# Patient Record
Sex: Female | Born: 1985 | Race: Black or African American | Hispanic: No | Marital: Single | Smoking: Never smoker
Health system: Southern US, Community
[De-identification: ages and names within clinical notes are randomized; demographics above are authoritative.]

## PROBLEM LIST (undated history)

## (undated) DIAGNOSIS — D219 Benign neoplasm of connective and other soft tissue, unspecified: Secondary | ICD-10-CM

---

## 2016-07-18 DIAGNOSIS — S63649D Sprain of metacarpophalangeal joint of unspecified thumb, subsequent encounter: Secondary | ICD-10-CM | POA: Diagnosis not present

## 2016-07-23 ENCOUNTER — Other Ambulatory Visit: Payer: Self-pay | Admitting: Sports Medicine

## 2016-07-24 ENCOUNTER — Other Ambulatory Visit: Payer: Self-pay | Admitting: Sports Medicine

## 2016-07-24 DIAGNOSIS — M65311 Trigger thumb, right thumb: Secondary | ICD-10-CM

## 2016-07-24 DIAGNOSIS — M79641 Pain in right hand: Secondary | ICD-10-CM

## 2016-08-08 ENCOUNTER — Ambulatory Visit
Admission: RE | Admit: 2016-08-08 | Discharge: 2016-08-08 | Disposition: A | Discharge status: Home / Self Care | Payer: BLUE CROSS/BLUE SHIELD | Source: Ambulatory Visit | Attending: Sports Medicine | Admitting: Sports Medicine

## 2016-08-08 DIAGNOSIS — M65311 Trigger thumb, right thumb: Secondary | ICD-10-CM

## 2016-08-08 DIAGNOSIS — M79644 Pain in right finger(s): Secondary | ICD-10-CM | POA: Diagnosis not present

## 2016-08-08 DIAGNOSIS — M79641 Pain in right hand: Secondary | ICD-10-CM

## 2016-08-08 MED ORDER — GADOBENATE DIMEGLUMINE 529 MG/ML IV SOLN
14.0000 mL | Freq: Once | INTRAVENOUS | Status: AC | PRN
  Administered 2016-08-08: 14 mL via INTRAVENOUS

## 2016-08-12 DIAGNOSIS — S63649D Sprain of metacarpophalangeal joint of unspecified thumb, subsequent encounter: Secondary | ICD-10-CM | POA: Diagnosis not present

## 2016-12-20 DIAGNOSIS — J069 Acute upper respiratory infection, unspecified: Secondary | ICD-10-CM | POA: Diagnosis not present

## 2017-01-13 DIAGNOSIS — Z124 Encounter for screening for malignant neoplasm of cervix: Secondary | ICD-10-CM | POA: Diagnosis not present

## 2017-01-13 DIAGNOSIS — Z01419 Encounter for gynecological examination (general) (routine) without abnormal findings: Secondary | ICD-10-CM | POA: Diagnosis not present

## 2017-01-13 DIAGNOSIS — Z6833 Body mass index (BMI) 33.0-33.9, adult: Secondary | ICD-10-CM | POA: Diagnosis not present

## 2017-01-13 DIAGNOSIS — N920 Excessive and frequent menstruation with regular cycle: Secondary | ICD-10-CM | POA: Diagnosis not present

## 2017-01-21 DIAGNOSIS — Z6833 Body mass index (BMI) 33.0-33.9, adult: Secondary | ICD-10-CM | POA: Diagnosis not present

## 2017-01-21 DIAGNOSIS — Z23 Encounter for immunization: Secondary | ICD-10-CM | POA: Diagnosis not present

## 2017-01-21 DIAGNOSIS — Z0000 Encounter for general adult medical examination without abnormal findings: Secondary | ICD-10-CM | POA: Diagnosis not present

## 2017-06-10 ENCOUNTER — Encounter (HOSPITAL_COMMUNITY): Payer: Self-pay | Admitting: *Deleted

## 2017-06-10 ENCOUNTER — Emergency Department (HOSPITAL_COMMUNITY)
Admission: EM | Admit: 2017-06-10 | Discharge: 2017-06-11 | Disposition: A | Discharge status: Home / Self Care | Payer: BLUE CROSS/BLUE SHIELD | Attending: Emergency Medicine | Admitting: Emergency Medicine

## 2017-06-10 ENCOUNTER — Emergency Department (HOSPITAL_COMMUNITY): Payer: BLUE CROSS/BLUE SHIELD

## 2017-06-10 DIAGNOSIS — R51 Headache: Secondary | ICD-10-CM | POA: Diagnosis not present

## 2017-06-10 DIAGNOSIS — R519 Headache, unspecified: Secondary | ICD-10-CM

## 2017-06-10 HISTORY — DX: Benign neoplasm of connective and other soft tissue, unspecified: D21.9

## 2017-06-10 NOTE — ED Triage Notes (Signed)
Pt has had occasional sharp shooting pains about 1-2 times a day for the past 2 weeks.  This pain lasts only a second or 2 and then goes away.  No nausea, vomiting or photosensitivity with this.  Pt denies any weakness or changes in vision but she decided to come in today due to tingling in right cheek.  No acute distress at this time.  Pt is alert and oriented

## 2017-06-10 NOTE — ED Notes (Signed)
Patient transported to CT 

## 2017-06-10 NOTE — ED Provider Notes (Signed)
[facility] DEPT Provider Note   CSN: [id number] Arrival date & time: 06/10/17  1920  By signing my name below, I, Ephriam Jenkins, attest that this documentation has been prepared under the direction and in the presence of Ruth Tully PA-C.  Electronically Signed: Ephriam Jenkins, ED Scribe. 06/10/17. 10:22 PM.  History   Chief Complaint Chief Complaint  Patient presents with  . Headache   HPI HPI Comments: Heather Chapman is a 31 y.o. female who presents to the Emergency Department complaining of intermittent right sided headache which radiates to the right side of her cheek, onset approximately two weeks ago. She describes the pain as lasting a few seconds and then subsides. Pt further reports a pins and needles sensation to her face during the onset of the intermittent pains. She presents here today because her symptoms were much more prevalent today, especially the sharp pains to the right side of her face. Pt denies any changes in vision. She has not taken any medication in attempt to relieve her symptoms. Pt denies any focal numbness or weakness. She is able to move all four extremities without difficulty. No new medications. No previous neck surgeries. No pain with neck movements. Denies any accidents or injuries recently. No nausea, sensitivity to light, blurry vision, loss of vision.   The history is provided by the patient. No language interpreter was used.    Past Medical History:  Diagnosis Date  . Fibroids     There are no active problems to display for this patient.   History reviewed. No pertinent surgical history.  OB History    No data available       Home Medications    Prior to Admission medications   Not on File    Family History No family history on file.  Social History Social History  Substance Use Topics  . Smoking status: Never Smoker  . Smokeless tobacco: Never Used  . Alcohol use Yes     Allergies   Patient has no known  allergies.   Review of Systems Review of Systems  HENT: Negative for ear discharge, ear pain, facial swelling and hearing loss.   Eyes: Negative for photophobia, pain, discharge and itching.  Gastrointestinal: Negative for nausea.  Neurological: Positive for headaches. Negative for syncope, weakness and numbness.     Physical Exam Updated Vital Signs BP 128/84 (BP Location: Right Arm)   Pulse 84   Temp 98.4 F (36.9 C) (Oral)   Resp (!) 22   Ht 4\' 11"  (1.499 m)   Wt 71.7 kg (158 lb)   LMP 05/26/2017 (Approximate)   SpO2 100%   BMI 31.91 kg/m   Physical Exam  Constitutional: She appears well-developed and well-nourished. No distress.  HENT:  Head: Normocephalic and atraumatic.  Nose: Nose normal.  Eyes: Conjunctivae and EOM are normal. Left eye exhibits no discharge. No scleral icterus.  Neck: Normal range of motion. Neck supple.  Cardiovascular: Normal rate and intact distal pulses.   Pulmonary/Chest: Effort normal. No respiratory distress.  Abdominal: Soft. Bowel sounds are normal. She exhibits no distension. There is no tenderness. There is no guarding.  Musculoskeletal: Normal range of motion. She exhibits no edema.  Neurological: She is alert. No cranial nerve deficit or sensory deficit. She exhibits normal muscle tone. Coordination normal.  Pupils equal and reactive. No facial asymmetry noted. Cranial nerves appear grossly intact. Sensation intact to light touch on face, BUE and BLE. Strength 5/5 in BUE and BLE. Normal patellar reflexes bilaterally.  Skin: Skin is warm and dry. No rash noted.  Psychiatric: She has a normal mood and affect.  Nursing note and vitals reviewed.    ED Treatments / Results  DIAGNOSTIC STUDIES: Oxygen Saturation is 100% on RA, normal by my interpretation.  COORDINATION OF CARE: 10:31 PM-Discussed treatment plan with pt at bedside and pt agreed to plan.   Labs (all labs ordered are listed, but only abnormal results are  displayed) Labs Reviewed - No data to display  EKG  EKG Interpretation None       Radiology Ct Head Wo Contrast  Result Date: 06/10/2017 CLINICAL DATA:  Right-sided headache.  Cheek pain. EXAM: CT HEAD WITHOUT CONTRAST TECHNIQUE: Contiguous axial images were obtained from the base of the skull through the vertex without intravenous contrast. COMPARISON:  None. FINDINGS: Brain: No evidence of acute infarction, hemorrhage, hydrocephalus, extra-axial collection or mass lesion/mass effect. Vascular: No hyperdense vessel or unexpected calcification. Skull: Normal. Negative for fracture or focal lesion. Sinuses/Orbits: Paranasal sinuses and mastoid air cells are clear. The visualized orbits are unremarkable. Other: None. IMPRESSION: No acute intracranial abnormality. Electronically Signed   By: Jeb Levering M.D.   On: 06/10/2017 23:44    Procedures Procedures (including critical care time)  Medications Ordered in ED Medications - No data to display   Initial Impression / Assessment and Plan / ED Course  I have reviewed the triage vital signs and the nursing notes.  Pertinent labs & imaging results that were available during my care of the patient were reviewed by me and considered in my medical decision making (see chart for details).     Patient presents to ED for intermittent headache on the right side of her head as well as some tingling on her cheek. She states that symptoms have been intermittent and only last a few seconds. She has not taken any medications to help with the pain. She denies any vision changes including no blurry vision, no loss of vision, loss of consciousness or head injury. She denies any numbness or weakness and she is ambulatory similar to baseline. She denies any pain on her face but states that it is just a tingling sensation. She states that neither the tingling sensation nor the headache are unbearable. She states that each episode improves on its own  without any intervention. She is afebrile with no history of fever. On physical exam her pupils are reactive. She has no gross cranial nerve deficit, she is alert and oriented, she has no abnormal coordination, no abnormal sensory deficit or abnormal tone. There are no focal findings on her neurological exam that would concern me. She has sensation intact to light touch in upper and lower extremities as well as the face. She did not experiencing any pain here in the ED today. CT of the head was obtained and was negative for acute intracranial process. Her symptoms could be due to a migraine or tension headache that she has been experiencing intermittently. I have low suspicion for trigeminal neuralgia as being the cause of her symptoms considering that she denies any sharp or shooting unbearable pain on her face. I encouraged her to follow up with her primary care provider and to take Tylenol as needed for the headache. Patient appears stable for discharge at this time. Should return precautions given for worsening or severe symptoms such as numbness, weakness, vision changes or trouble walking.  Final Clinical Impressions(s) / ED Diagnoses   Final diagnoses:  Acute nonintractable headache, unspecified headache type  New Prescriptions There are no discharge medications for this patient.  I personally performed the services described in this documentation, which was scribed in my presence. The recorded information has been reviewed and is accurate.     Delia Heady, PA-C 06/11/17 [id number]    Fredia Sorrow, MD 06/13/17 [phone]

## 2017-06-11 NOTE — Discharge Instructions (Signed)
Follow-up with PCP for further evaluation. Return to ED for worsening pain, vision changes, head injury, loss of consciousness, neck pain, numbness or weakness.

## 2017-06-11 NOTE — ED Notes (Signed)
Pt is in stable condition upon d/c and ambulates from ED. 

## 2018-01-22 DIAGNOSIS — Z713 Dietary counseling and surveillance: Secondary | ICD-10-CM | POA: Diagnosis not present

## 2018-01-22 DIAGNOSIS — Z136 Encounter for screening for cardiovascular disorders: Secondary | ICD-10-CM | POA: Diagnosis not present

## 2018-01-22 DIAGNOSIS — Z13220 Encounter for screening for lipoid disorders: Secondary | ICD-10-CM | POA: Diagnosis not present

## 2018-01-22 DIAGNOSIS — Z6831 Body mass index (BMI) 31.0-31.9, adult: Secondary | ICD-10-CM | POA: Diagnosis not present

## 2018-05-14 ENCOUNTER — Other Ambulatory Visit: Payer: Self-pay | Admitting: Obstetrics and Gynecology

## 2018-05-14 ENCOUNTER — Other Ambulatory Visit (HOSPITAL_COMMUNITY)
Admission: RE | Admit: 2018-05-14 | Discharge: 2018-05-14 | Disposition: A | Discharge status: Home / Self Care | Payer: BLUE CROSS/BLUE SHIELD | Source: Ambulatory Visit | Attending: Obstetrics and Gynecology | Admitting: Obstetrics and Gynecology

## 2018-05-14 DIAGNOSIS — Z01411 Encounter for gynecological examination (general) (routine) with abnormal findings: Secondary | ICD-10-CM | POA: Insufficient documentation

## 2018-05-14 DIAGNOSIS — N920 Excessive and frequent menstruation with regular cycle: Secondary | ICD-10-CM | POA: Diagnosis not present

## 2018-05-14 DIAGNOSIS — D219 Benign neoplasm of connective and other soft tissue, unspecified: Secondary | ICD-10-CM | POA: Diagnosis not present

## 2018-05-19 LAB — CYTOLOGY - PAP
Diagnosis: NEGATIVE
HPV: NOT DETECTED

## 2018-06-09 DIAGNOSIS — D219 Benign neoplasm of connective and other soft tissue, unspecified: Secondary | ICD-10-CM | POA: Diagnosis not present

## 2018-06-15 DIAGNOSIS — D251 Intramural leiomyoma of uterus: Secondary | ICD-10-CM | POA: Diagnosis not present

## 2018-06-15 DIAGNOSIS — N920 Excessive and frequent menstruation with regular cycle: Secondary | ICD-10-CM | POA: Diagnosis not present

## 2018-06-23 IMAGING — CT CT HEAD W/O CM
3 series · 16 of 47 positions shown, 19 images · non-contrast
Comparison: None.

CLINICAL DATA: Right-sided headache.  Cheek pain.

EXAM:
CT HEAD WITHOUT CONTRAST
TECHNIQUE: Contiguous axial images were obtained from the base of the skull
through the vertex without intravenous contrast.

[Series 3: head 5.0 h30s · axial · 0.42mm/px · z∈[-98,+27]mm · 10 of 30 slices shown, 13 images]
[im 3/30  brain]
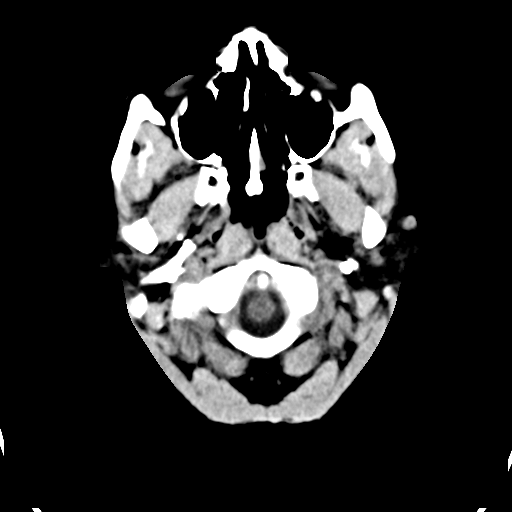
[im 3/30  bone]
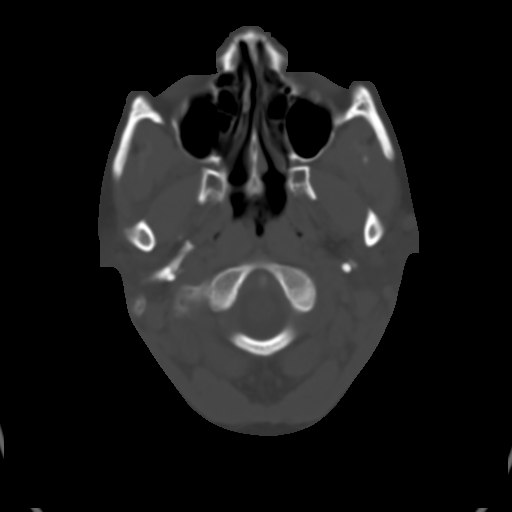
[im 6/30  brain]
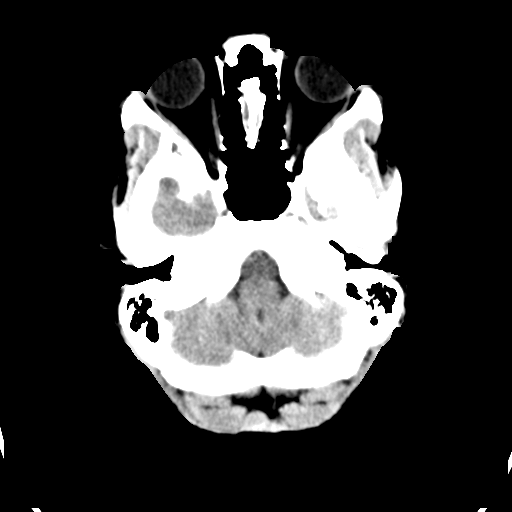
[im 9/30  brain]
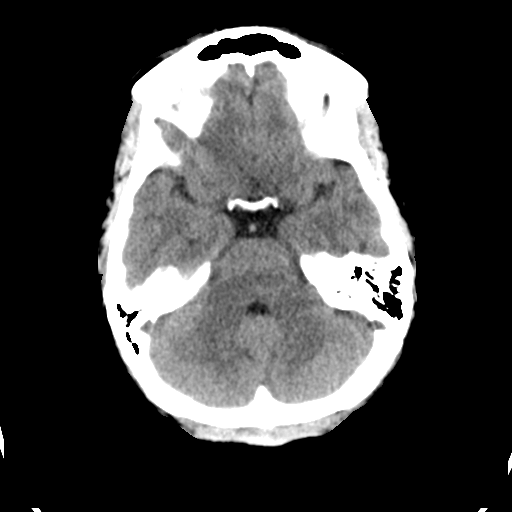
[im 11/30  brain]
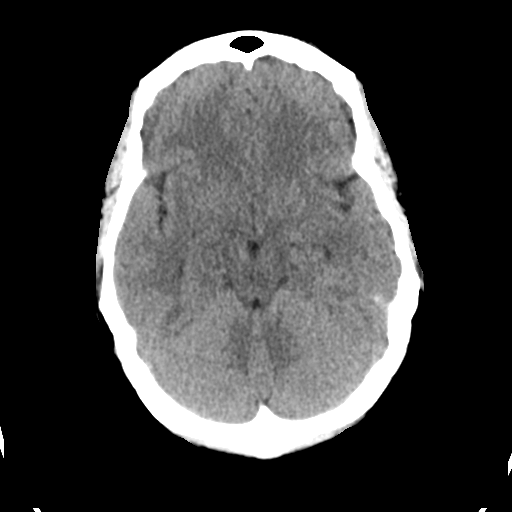
[im 14/30  brain]
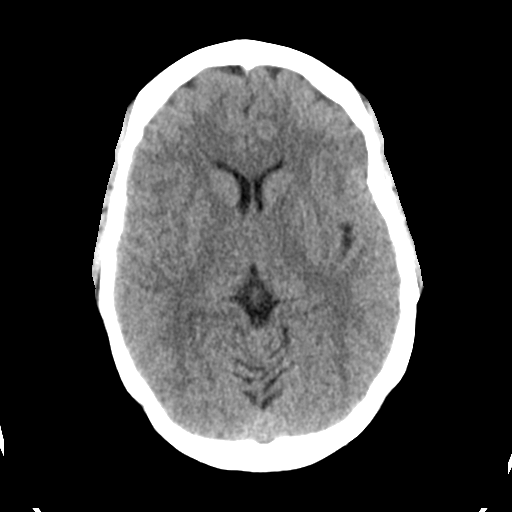
[im 14/30  bone]
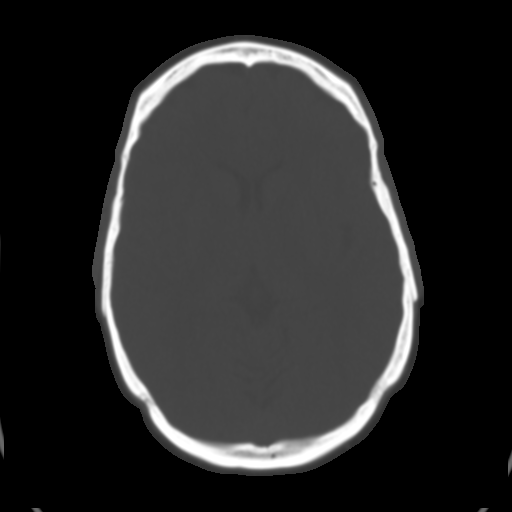
[im 17/30  brain]
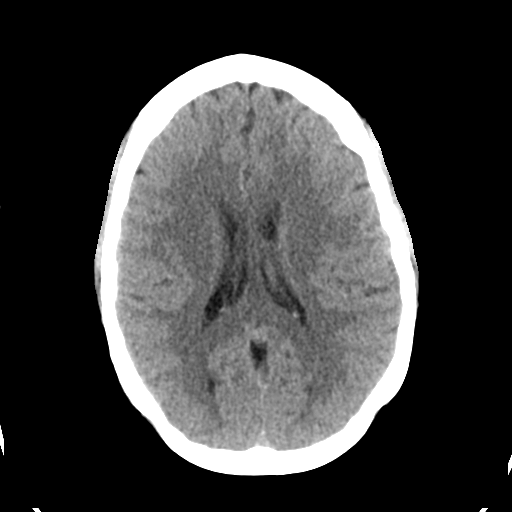
[im 20/30  brain]
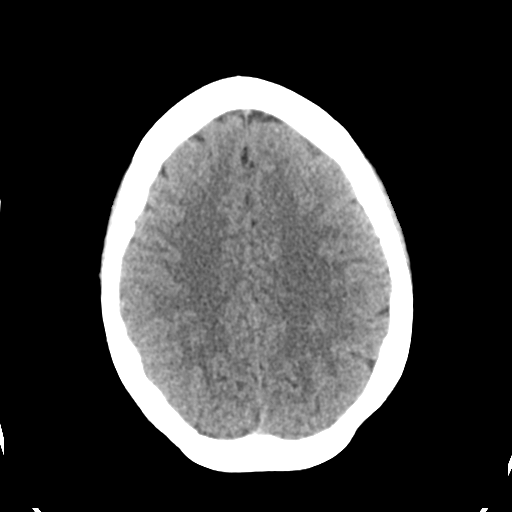
[im 23/30  brain]
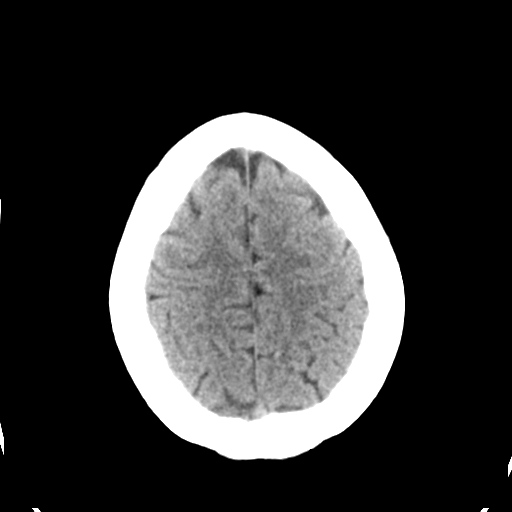
[im 25/30  brain]
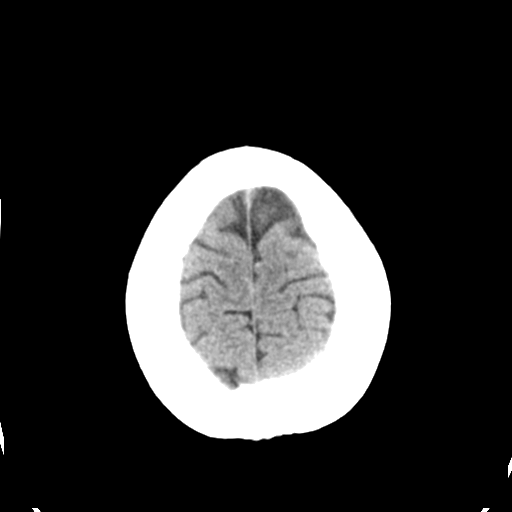
[im 25/30  bone]
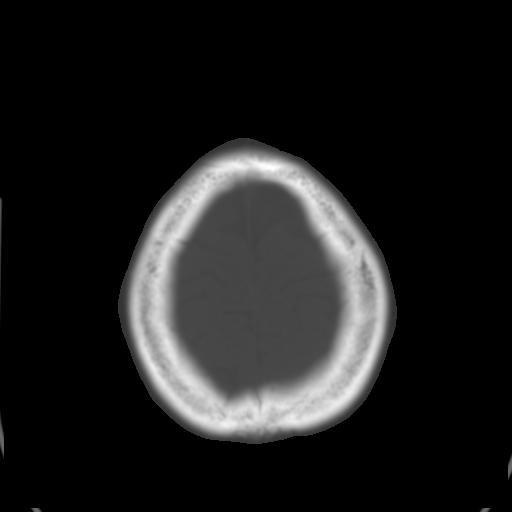
[im 28/30  brain]
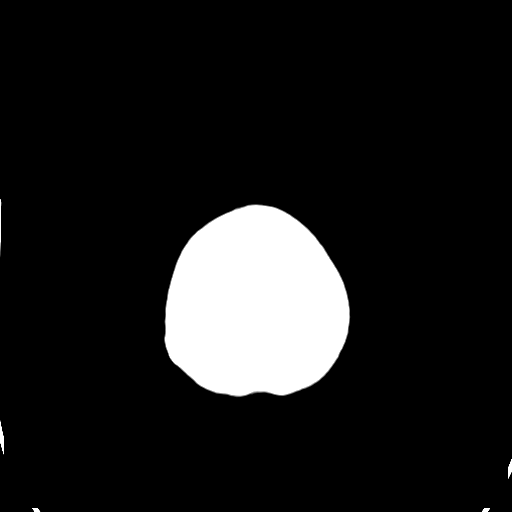

[Series 5: head 3.0 mpr cor · coronal · 0.29mm/px · 3 of 67 slices shown]
[im 23/67  brain]
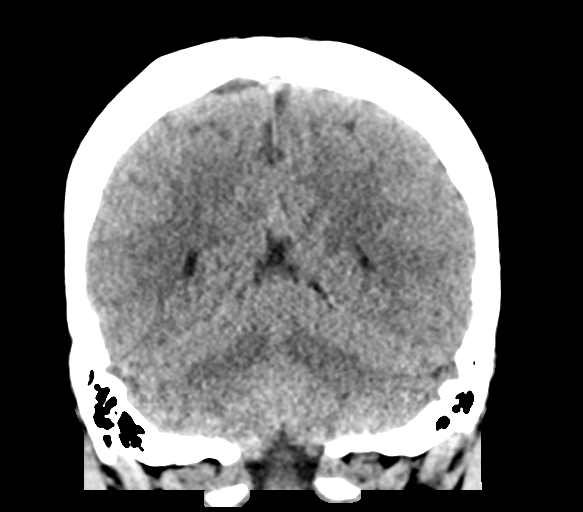
[im 30/67  brain]
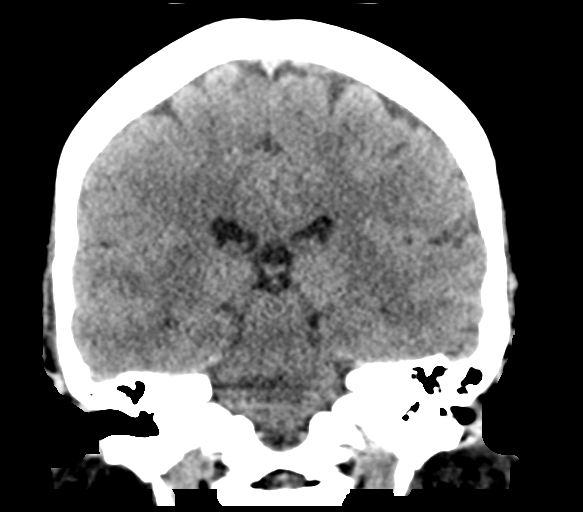
[im 37/67  brain]
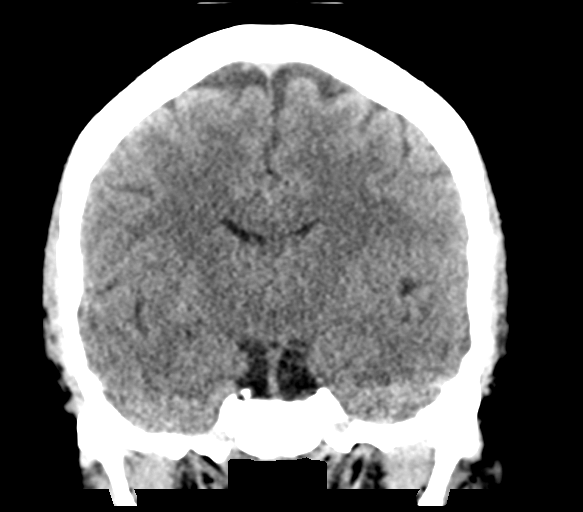

[Series 6: head 3.0 mpr sag · sagittal · 0.30mm/px · 3 of 53 slices shown]
[im 18/53  brain]
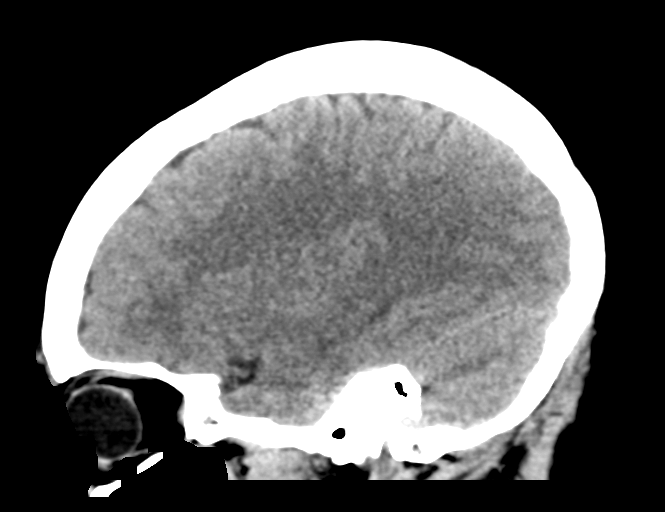
[im 27/53  brain]
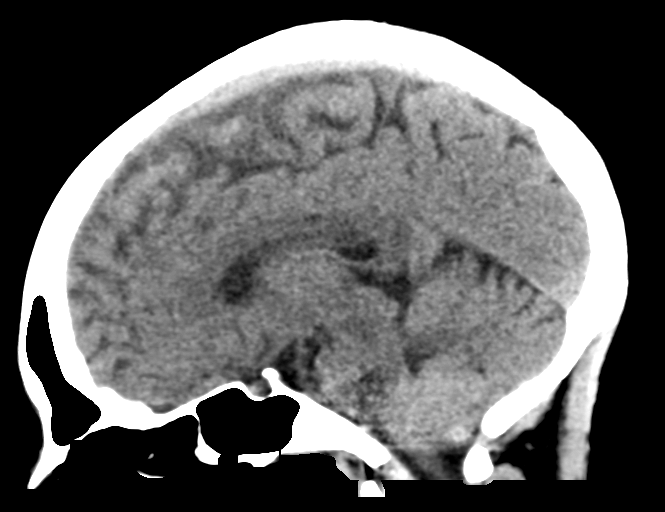
[im 35/53  brain]
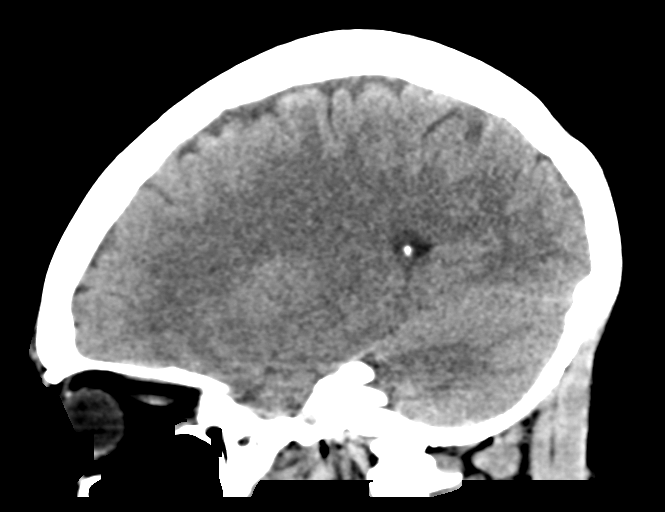

[16 of 47 positions shown; findings below may reference images not displayed]

FINDINGS: Brain: No evidence of acute infarction, hemorrhage, hydrocephalus,
extra-axial collection or mass lesion/mass effect.

Vascular: No hyperdense vessel or unexpected calcification.

Skull: Normal. Negative for fracture or focal lesion.

Sinuses/Orbits: Paranasal sinuses and mastoid air cells are clear.
The visualized orbits are unremarkable.

Other: None.
IMPRESSION: No acute intracranial abnormality.

## 2018-12-30 DIAGNOSIS — N898 Other specified noninflammatory disorders of vagina: Secondary | ICD-10-CM | POA: Diagnosis not present

## 2018-12-30 DIAGNOSIS — N920 Excessive and frequent menstruation with regular cycle: Secondary | ICD-10-CM | POA: Diagnosis not present

## 2018-12-30 DIAGNOSIS — R350 Frequency of micturition: Secondary | ICD-10-CM | POA: Diagnosis not present

## 2019-01-18 ENCOUNTER — Other Ambulatory Visit: Payer: Self-pay | Admitting: Obstetrics and Gynecology

## 2019-01-18 DIAGNOSIS — N920 Excessive and frequent menstruation with regular cycle: Secondary | ICD-10-CM | POA: Diagnosis not present

## 2019-01-18 DIAGNOSIS — D259 Leiomyoma of uterus, unspecified: Secondary | ICD-10-CM | POA: Diagnosis not present

## 2019-01-18 DIAGNOSIS — D219 Benign neoplasm of connective and other soft tissue, unspecified: Secondary | ICD-10-CM | POA: Diagnosis not present

## 2019-01-18 DIAGNOSIS — Z3202 Encounter for pregnancy test, result negative: Secondary | ICD-10-CM | POA: Diagnosis not present

## 2019-01-27 DIAGNOSIS — Z13220 Encounter for screening for lipoid disorders: Secondary | ICD-10-CM | POA: Diagnosis not present

## 2019-01-27 DIAGNOSIS — Z131 Encounter for screening for diabetes mellitus: Secondary | ICD-10-CM | POA: Diagnosis not present

## 2019-01-27 DIAGNOSIS — Z713 Dietary counseling and surveillance: Secondary | ICD-10-CM | POA: Diagnosis not present

## 2019-01-27 DIAGNOSIS — Z136 Encounter for screening for cardiovascular disorders: Secondary | ICD-10-CM | POA: Diagnosis not present

## 2019-02-08 DIAGNOSIS — J069 Acute upper respiratory infection, unspecified: Secondary | ICD-10-CM | POA: Diagnosis not present

## 2019-03-08 DIAGNOSIS — Z3162 Encounter for fertility preservation counseling: Secondary | ICD-10-CM | POA: Diagnosis not present

## 2019-03-08 DIAGNOSIS — D251 Intramural leiomyoma of uterus: Secondary | ICD-10-CM | POA: Diagnosis not present

## 2019-03-11 DIAGNOSIS — D508 Other iron deficiency anemias: Secondary | ICD-10-CM | POA: Diagnosis not present

## 2019-03-11 DIAGNOSIS — E288 Other ovarian dysfunction: Secondary | ICD-10-CM | POA: Diagnosis not present

## 2019-03-17 DIAGNOSIS — D251 Intramural leiomyoma of uterus: Secondary | ICD-10-CM | POA: Diagnosis not present

## 2019-03-17 DIAGNOSIS — D252 Subserosal leiomyoma of uterus: Secondary | ICD-10-CM | POA: Diagnosis not present

## 2019-03-17 DIAGNOSIS — Z319 Encounter for procreative management, unspecified: Secondary | ICD-10-CM | POA: Diagnosis not present

## 2019-03-17 DIAGNOSIS — Z3162 Encounter for fertility preservation counseling: Secondary | ICD-10-CM | POA: Diagnosis not present

## 2019-05-18 DIAGNOSIS — D259 Leiomyoma of uterus, unspecified: Secondary | ICD-10-CM | POA: Diagnosis not present

## 2019-05-18 DIAGNOSIS — N920 Excessive and frequent menstruation with regular cycle: Secondary | ICD-10-CM | POA: Diagnosis not present

## 2019-05-18 DIAGNOSIS — Z01419 Encounter for gynecological examination (general) (routine) without abnormal findings: Secondary | ICD-10-CM | POA: Diagnosis not present

## 2019-05-18 DIAGNOSIS — F4322 Adjustment disorder with anxiety: Secondary | ICD-10-CM | POA: Diagnosis not present

## 2019-05-29 DIAGNOSIS — F4322 Adjustment disorder with anxiety: Secondary | ICD-10-CM | POA: Diagnosis not present

## 2019-06-16 DIAGNOSIS — F4322 Adjustment disorder with anxiety: Secondary | ICD-10-CM | POA: Diagnosis not present

## 2019-07-01 DIAGNOSIS — Z20828 Contact with and (suspected) exposure to other viral communicable diseases: Secondary | ICD-10-CM | POA: Diagnosis not present

## 2019-07-07 ENCOUNTER — Other Ambulatory Visit: Payer: Self-pay

## 2019-07-07 DIAGNOSIS — Z20822 Contact with and (suspected) exposure to covid-19: Secondary | ICD-10-CM

## 2019-07-08 LAB — NOVEL CORONAVIRUS, NAA: SARS-CoV-2, NAA: NOT DETECTED

## 2019-09-09 DIAGNOSIS — R0602 Shortness of breath: Secondary | ICD-10-CM | POA: Diagnosis not present

## 2019-09-09 DIAGNOSIS — Z20828 Contact with and (suspected) exposure to other viral communicable diseases: Secondary | ICD-10-CM | POA: Diagnosis not present

## 2019-09-09 DIAGNOSIS — R05 Cough: Secondary | ICD-10-CM | POA: Diagnosis not present

## 2019-10-22 DIAGNOSIS — D250 Submucous leiomyoma of uterus: Secondary | ICD-10-CM | POA: Diagnosis not present

## 2019-10-22 DIAGNOSIS — D500 Iron deficiency anemia secondary to blood loss (chronic): Secondary | ICD-10-CM | POA: Diagnosis not present

## 2019-11-24 DIAGNOSIS — Z20828 Contact with and (suspected) exposure to other viral communicable diseases: Secondary | ICD-10-CM | POA: Diagnosis not present

## 2019-11-24 DIAGNOSIS — R05 Cough: Secondary | ICD-10-CM | POA: Diagnosis not present

## 2019-12-23 DIAGNOSIS — Z20828 Contact with and (suspected) exposure to other viral communicable diseases: Secondary | ICD-10-CM | POA: Diagnosis not present

## 2019-12-30 DIAGNOSIS — Z3202 Encounter for pregnancy test, result negative: Secondary | ICD-10-CM | POA: Diagnosis not present

## 2019-12-30 DIAGNOSIS — Z319 Encounter for procreative management, unspecified: Secondary | ICD-10-CM | POA: Diagnosis not present

## 2020-01-07 DIAGNOSIS — D251 Intramural leiomyoma of uterus: Secondary | ICD-10-CM | POA: Diagnosis not present

## 2020-01-07 DIAGNOSIS — R102 Pelvic and perineal pain: Secondary | ICD-10-CM | POA: Diagnosis not present

## 2020-03-03 ENCOUNTER — Ambulatory Visit: Payer: BC Managed Care – PPO

## 2020-03-23 ENCOUNTER — Ambulatory Visit: Payer: BC Managed Care – PPO

## 2020-03-29 DIAGNOSIS — D259 Leiomyoma of uterus, unspecified: Secondary | ICD-10-CM | POA: Diagnosis not present

## 2020-03-29 DIAGNOSIS — N920 Excessive and frequent menstruation with regular cycle: Secondary | ICD-10-CM | POA: Diagnosis not present

## 2020-03-29 DIAGNOSIS — D500 Iron deficiency anemia secondary to blood loss (chronic): Secondary | ICD-10-CM | POA: Diagnosis not present

## 2020-03-29 DIAGNOSIS — Z319 Encounter for procreative management, unspecified: Secondary | ICD-10-CM | POA: Diagnosis not present

## 2020-05-19 DIAGNOSIS — F4322 Adjustment disorder with anxiety: Secondary | ICD-10-CM | POA: Diagnosis not present

## 2020-06-28 DIAGNOSIS — D500 Iron deficiency anemia secondary to blood loss (chronic): Secondary | ICD-10-CM | POA: Diagnosis not present

## 2020-06-28 DIAGNOSIS — Z113 Encounter for screening for infections with a predominantly sexual mode of transmission: Secondary | ICD-10-CM | POA: Diagnosis not present

## 2020-06-28 DIAGNOSIS — Z131 Encounter for screening for diabetes mellitus: Secondary | ICD-10-CM | POA: Diagnosis not present

## 2020-06-28 DIAGNOSIS — N898 Other specified noninflammatory disorders of vagina: Secondary | ICD-10-CM | POA: Diagnosis not present

## 2020-06-28 DIAGNOSIS — Z13220 Encounter for screening for lipoid disorders: Secondary | ICD-10-CM | POA: Diagnosis not present

## 2020-06-28 DIAGNOSIS — Z01419 Encounter for gynecological examination (general) (routine) without abnormal findings: Secondary | ICD-10-CM | POA: Diagnosis not present

## 2020-07-12 DIAGNOSIS — N920 Excessive and frequent menstruation with regular cycle: Secondary | ICD-10-CM | POA: Diagnosis not present

## 2020-07-13 DIAGNOSIS — Z1152 Encounter for screening for COVID-19: Secondary | ICD-10-CM | POA: Diagnosis not present

## 2020-09-07 DIAGNOSIS — D259 Leiomyoma of uterus, unspecified: Secondary | ICD-10-CM | POA: Diagnosis not present

## 2020-09-07 DIAGNOSIS — Z6831 Body mass index (BMI) 31.0-31.9, adult: Secondary | ICD-10-CM | POA: Diagnosis not present

## 2020-10-03 DIAGNOSIS — N83202 Unspecified ovarian cyst, left side: Secondary | ICD-10-CM | POA: Diagnosis not present

## 2020-10-03 DIAGNOSIS — D259 Leiomyoma of uterus, unspecified: Secondary | ICD-10-CM | POA: Diagnosis not present

## 2020-11-28 DIAGNOSIS — R509 Fever, unspecified: Secondary | ICD-10-CM | POA: Diagnosis not present

## 2023-07-17 ENCOUNTER — Other Ambulatory Visit: Payer: Self-pay | Admitting: Pharmacist
